# Patient Record
Sex: Male | Born: 1980 | Race: Black or African American | Hispanic: No | Marital: Single | Smoking: Current every day smoker
Health system: Southern US, Community
[De-identification: ages and names within clinical notes are randomized; demographics above are authoritative.]

---

## 2012-02-06 ENCOUNTER — Emergency Department (HOSPITAL_BASED_OUTPATIENT_CLINIC_OR_DEPARTMENT_OTHER)
Admission: EM | Admit: 2012-02-06 | Discharge: 2012-02-06 | Disposition: A | Discharge status: Home / Self Care | Payer: Self-pay | Attending: Emergency Medicine | Admitting: Emergency Medicine

## 2012-02-06 ENCOUNTER — Encounter (HOSPITAL_BASED_OUTPATIENT_CLINIC_OR_DEPARTMENT_OTHER): Payer: Self-pay | Admitting: Family Medicine

## 2012-02-06 DIAGNOSIS — B029 Zoster without complications: Secondary | ICD-10-CM | POA: Insufficient documentation

## 2012-02-06 MED ORDER — OXYCODONE-ACETAMINOPHEN 5-325 MG PO TABS: 2.0000 | ORAL_TABLET | ORAL | Status: AC | PRN

## 2012-02-06 MED ORDER — ACYCLOVIR 400 MG PO TABS: 400.0000 mg | ORAL_TABLET | Freq: Four times a day (QID) | ORAL | Status: AC

## 2012-02-06 NOTE — Discharge Instructions (Signed)

## 2012-02-06 NOTE — ED Notes (Signed)
Pt has rash and pain to left side x 1 wk.

## 2012-02-06 NOTE — ED Provider Notes (Addendum)
History     CSN: [id number]  Arrival date & time 02/06/12  1037   First MD Initiated Contact with Patient 02/06/12 1117      Chief Complaint  Patient presents with  . Rash    (Consider location/radiation/quality/duration/timing/severity/associated sxs/prior treatment) HPI Comments: Patient presents with a one-week history of a rash to his left lower chest area. He woke up one morning with burning pain and the next day developed some small blisters in the area. He states that the rash has gotten better, but he still has a burning pain to the area. Denies any fevers, vomiting, or other symptoms associated with it. He did have chickenpox as a child  Patient is a 31 y.o. male presenting with rash. The history is provided by the patient.  Rash  This is a new problem. The current episode started more than 1 week ago. The problem has been rapidly improving. The problem is associated with nothing. There has been no fever. The pain is moderate. The pain has been constant since onset. Associated symptoms include blisters and pain.    History reviewed. No pertinent past medical history.  History reviewed. No pertinent past surgical history.  No family history on file.  History  Substance Use Topics  . Smoking status: Current Everyday Smoker  . Smokeless tobacco: Not on file  . Alcohol Use: Yes      Review of Systems  Constitutional: Negative for fever, chills, diaphoresis and fatigue.  HENT: Negative for congestion, rhinorrhea and sneezing.   Eyes: Negative.   Respiratory: Negative for cough, chest tightness and shortness of breath.   Cardiovascular: Negative for chest pain and leg swelling.  Gastrointestinal: Negative for nausea, vomiting, abdominal pain, diarrhea and blood in stool.  Genitourinary: Negative for frequency, hematuria, flank pain and difficulty urinating.  Musculoskeletal: Negative for back pain and arthralgias.  Skin: Positive for rash.  Neurological: Negative for  dizziness, speech difficulty, weakness, numbness and headaches.    Allergies  Review of patient's allergies indicates no known allergies.  Home Medications   Current Outpatient Rx  Name Route Sig Dispense Refill  . ACYCLOVIR 400 MG PO TABS Oral Take 1 tablet (400 mg total) by mouth 4 (four) times daily. 28 tablet 0  . OXYCODONE-ACETAMINOPHEN 5-325 MG PO TABS Oral Take 2 tablets by mouth every 4 (four) hours as needed for pain. 20 tablet 0    BP 109/68  Pulse 84  Temp(Src) 98.3 F (36.8 C) (Oral)  Resp 16  Ht 6' (1.829 m)  Wt 162 lb (73.483 kg)  BMI 21.97 kg/m2  SpO2 100%  Physical Exam  Constitutional: He is oriented to person, place, and time. He appears well-developed and well-nourished.  HENT:  Head: Normocephalic and atraumatic.  Eyes: Pupils are equal, round, and reactive to light.  Neck: Normal range of motion. Neck supple.  Cardiovascular: Normal rate, regular rhythm and normal heart sounds.   Pulmonary/Chest: Effort normal and breath sounds normal. No respiratory distress. He has no wheezes. He has no rales. He exhibits no tenderness.  Abdominal: Soft. Bowel sounds are normal. There is no tenderness. There is no rebound and no guarding.  Musculoskeletal: Normal range of motion. He exhibits no edema.  Lymphadenopathy:    He has no cervical adenopathy.  Neurological: He is alert and oriented to person, place, and time.  Skin: Skin is warm and dry. Rash noted.       He has a small area of rash along the left lower rib area along  his chest. There some small healing blisters. There is no erythema or signs of cellulitis. It does appear to follow a dermatome  Psychiatric: He has a normal mood and affect.    ED Course  Procedures (including critical care time)  Labs Reviewed - No data to display No results found.   1. Shingles       MDM  Rash appears consistent with shingles. We'll start him on acyclovir and pain medicine and encourage follow up with primary  care physician        Rolan Bucco, MD 02/06/12 1402  Rolan Bucco, MD 02/06/12 1402

## 2018-12-25 ENCOUNTER — Other Ambulatory Visit: Payer: Self-pay

## 2018-12-25 ENCOUNTER — Encounter: Payer: Self-pay | Admitting: Emergency Medicine

## 2018-12-25 ENCOUNTER — Emergency Department
Admission: EM | Admit: 2018-12-25 | Discharge: 2018-12-25 | Disposition: A | Discharge status: Home / Self Care | Payer: Self-pay | Source: Home / Self Care | Attending: Family Medicine | Admitting: Family Medicine

## 2018-12-25 DIAGNOSIS — R110 Nausea: Secondary | ICD-10-CM

## 2018-12-25 LAB — POCT CBC W AUTO DIFF (K'VILLE URGENT CARE)

## 2018-12-25 LAB — POCT URINALYSIS DIP (MANUAL ENTRY)
Bilirubin, UA: NEGATIVE
Blood, UA: NEGATIVE
Glucose, UA: NEGATIVE mg/dL
Leukocytes, UA: NEGATIVE
Nitrite, UA: NEGATIVE
Protein Ur, POC: NEGATIVE mg/dL
Spec Grav, UA: 1.025 (ref 1.010–1.025)
Urobilinogen, UA: 1 E.U./dL
pH, UA: 5.5 (ref 5.0–8.0)

## 2018-12-25 MED ORDER — ONDANSETRON 4 MG PO TBDP: ORAL_TABLET | ORAL | 0 refills | Status: DC

## 2018-12-25 MED ORDER — ONDANSETRON 4 MG PO TBDP: 4.0000 mg | ORAL_TABLET | Freq: Once | ORAL | Status: DC

## 2018-12-25 NOTE — Discharge Instructions (Signed)
Begin clear liquids for about 12 hours, then may begin a BRAT diet (Bananas, Rice, Applesauce, Toast) when nausea resolved.  Then gradually advance to a regular diet as tolerated.    °  °If symptoms become significantly worse during the night or over the weekend, proceed to the local emergency room.  °

## 2018-12-25 NOTE — ED Provider Notes (Signed)
[facility]    CSN: [id number] Arrival date & time: 12/25/18  0940     History   Chief Complaint Chief Complaint  Patient presents with  . Nausea    HPI Johnathan Good is a 38 y.o. male.   Patient complains of onset of onset of abdominal pain and nausea without vomiting about 5 days ago.  He denies vomiting or diarrhea and his symptoms have been worse during the night.  He states that he had been working outside in the heat, and had felt mildly short of breath.  He denies chest pain, cough, GI, or URI symptoms.   The history is provided by the patient.  Abdominal Pain  Pain location:  Periumbilical Pain quality: cramping   Pain radiates to:  Does not radiate Pain severity:  Mild Onset quality:  Sudden Duration:  5 days Timing:  Sporadic Progression:  Unchanged Chronicity:  New Context: eating   Context: not alcohol use, not awakening from sleep, not diet changes, not medication withdrawal, not recent illness, not recent travel, not sick contacts, not suspicious food intake and not trauma   Relieved by:  Nothing Worsened by:  Eating Ineffective treatments:  None tried Associated symptoms: anorexia, belching, chills, fatigue and nausea   Associated symptoms: no chest pain, no constipation, no cough, no diarrhea, no dysuria, no fever, no hematemesis, no hematochezia, no hematuria, no melena, no shortness of breath, no sore throat and no vomiting   Risk factors: has not had multiple surgeries     History reviewed. No pertinent past medical history.  There are no active problems to display for this patient.   History reviewed. No pertinent surgical history.     Home Medications    Prior to Admission medications   Medication Sig Start Date End Date Taking? Authorizing Provider  ondansetron (ZOFRAN ODT) 4 MG disintegrating tablet Take one tab by mouth Q6hr prn nausea.  Dissolve under tongue. 12/25/18   Lattie HawBeese, Paton Crum A, MD    Family History History  reviewed. No pertinent family history.  Social History Social History   Tobacco Use  . Smoking status: Current Every Day Smoker  . Smokeless tobacco: Never Used  Substance Use Topics  . Alcohol use: Yes  . Drug use: No     Allergies   Patient has no known allergies.   Review of Systems Review of Systems  Constitutional: Positive for chills and fatigue. Negative for fever.  HENT: Negative for sore throat.   Respiratory: Negative for cough and shortness of breath.   Cardiovascular: Negative for chest pain.  Gastrointestinal: Positive for abdominal pain, anorexia and nausea. Negative for constipation, diarrhea, hematemesis, hematochezia, melena and vomiting.  Genitourinary: Negative for dysuria and hematuria.     Physical Exam Triage Vital Signs ED Triage Vitals  Enc Vitals Group     BP 12/25/18 1032 109/73     Pulse Rate 12/25/18 1032 93     Resp --      Temp 12/25/18 1032 98.6 F (37 C)     Temp Source 12/25/18 1032 Oral     SpO2 12/25/18 1056 97 %     Weight 12/25/18 1035 162 lb (73.5 kg)     Height --      Head Circumference --      Peak Flow --      Pain Score --      Pain Loc --      Pain Edu? --      Excl. in GC? --  No data found.  Updated Vital Signs BP 109/73 (BP Location: Right Arm)   Pulse 93   Temp 98.6 F (37 C) (Oral)   Wt 73.5 kg   SpO2 97%   BMI 21.97 kg/m   Visual Acuity Right Eye Distance:   Left Eye Distance:   Bilateral Distance:    Right Eye Near:   Left Eye Near:    Bilateral Near:     Physical Exam Vitals signs and nursing note reviewed.  Constitutional:      General: He is not in acute distress.    Appearance: Normal appearance.  HENT:     Head: Normocephalic.     Right Ear: External ear normal.     Left Ear: External ear normal.     Nose: Nose normal.     Mouth/Throat:     Mouth: Mucous membranes are moist.     Pharynx: Oropharynx is clear.  Eyes:     Pupils: Pupils are equal, round, and reactive to light.   Cardiovascular:     Rate and Rhythm: Normal rate.     Heart sounds: Normal heart sounds.  Pulmonary:     Breath sounds: Normal breath sounds.  Abdominal:     General: Abdomen is flat. Bowel sounds are normal.     Palpations: Abdomen is soft. There is no hepatomegaly, splenomegaly or mass.     Tenderness: There is abdominal tenderness in the right lower quadrant, periumbilical area and left lower quadrant. There is no right CVA tenderness, left CVA tenderness, guarding or rebound. Negative signs include Murphy's sign and McBurney's sign.     Hernia: No hernia is present.    Musculoskeletal:     Right lower leg: No edema.     Left lower leg: No edema.  Lymphadenopathy:     Cervical: No cervical adenopathy.  Skin:    General: Skin is warm and dry.     Findings: No rash.  Neurological:     Mental Status: He is alert.      UC Treatments / Results  Labs (all labs ordered are listed, but only abnormal results are displayed) Labs Reviewed  POCT URINALYSIS DIP (MANUAL ENTRY) - Abnormal; Notable for the following components:      Result Value   Ketones, POC UA trace (5) (*)    All other components within normal limits  POCT CBC W AUTO DIFF ([facility]):  WBC 7.4; LY 47.0; MO 2.8; GR 50.2; Hgb 14.5; Platelets 206     EKG None  Radiology No results found.  Procedures Procedures (including critical care time)  Medications Ordered in UC Medications  ondansetron (ZOFRAN-ODT) disintegrating tablet 4 mg (has no administration in time range)    Initial Impression / Assessment and Plan / UC Course  I have reviewed the triage vital signs and the nursing notes.  Pertinent labs & imaging results that were available during my care of the patient were reviewed by me and considered in my medical decision making (see chart for details).    Administered Zofran ODT  PO; given Rx for same. Normal CBC and urinalysis reassuring. Suspect viral gastroenteritis. Followup with  Family Doctor if not improved in about 5 days.   Final Clinical Impressions(s) / UC Diagnoses   Final diagnoses:  Nausea     Discharge Instructions     Begin clear liquids for about 12 hours, then may begin a BRAT diet (Bananas, Rice, Applesauce, Toast) when nausea  resolved.  Then gradually advance to  a regular diet as tolerated.     If symptoms become significantly worse during the night or over the weekend, proceed to the local emergency room.     ED Prescriptions    Medication Sig Dispense Auth. Provider   ondansetron (ZOFRAN ODT) 4 MG disintegrating tablet Take one tab by mouth Q6hr prn nausea.  Dissolve under tongue. 12 tablet Lattie Haw, MD         Lattie Haw, MD 01/03/19 2241

## 2018-12-25 NOTE — ED Triage Notes (Signed)
Pt c/o abdominal pain and nausea x5 days. Worse at night. No Vomiting or diarrhea.

## 2018-12-28 ENCOUNTER — Telehealth: Payer: Self-pay

## 2018-12-28 NOTE — Telephone Encounter (Signed)
VM not set up.

## 2019-01-09 ENCOUNTER — Other Ambulatory Visit: Payer: Self-pay

## 2019-01-09 ENCOUNTER — Encounter (HOSPITAL_BASED_OUTPATIENT_CLINIC_OR_DEPARTMENT_OTHER): Payer: Self-pay | Admitting: *Deleted

## 2019-01-09 ENCOUNTER — Emergency Department (HOSPITAL_BASED_OUTPATIENT_CLINIC_OR_DEPARTMENT_OTHER)
Admission: EM | Admit: 2019-01-09 | Discharge: 2019-01-09 | Disposition: A | Discharge status: Home / Self Care | Payer: Self-pay | Attending: Emergency Medicine | Admitting: Emergency Medicine

## 2019-01-09 DIAGNOSIS — F17210 Nicotine dependence, cigarettes, uncomplicated: Secondary | ICD-10-CM | POA: Insufficient documentation

## 2019-01-09 DIAGNOSIS — E860 Dehydration: Secondary | ICD-10-CM | POA: Insufficient documentation

## 2019-01-09 DIAGNOSIS — Z79899 Other long term (current) drug therapy: Secondary | ICD-10-CM | POA: Insufficient documentation

## 2019-01-09 DIAGNOSIS — E876 Hypokalemia: Secondary | ICD-10-CM | POA: Insufficient documentation

## 2019-01-09 LAB — CBG MONITORING, ED: Glucose-Capillary: 142 mg/dL — ABNORMAL HIGH (ref 70–99)

## 2019-01-09 LAB — CBC WITH DIFFERENTIAL/PLATELET
Abs Immature Granulocytes: 0.02 10*3/uL (ref 0.00–0.07)
Basophils Absolute: 0.1 10*3/uL (ref 0.0–0.1)
Basophils Relative: 1 %
Eosinophils Absolute: 0.1 10*3/uL (ref 0.0–0.5)
Eosinophils Relative: 1 %
HCT: 39.6 % (ref 39.0–52.0)
Hemoglobin: 13.1 g/dL (ref 13.0–17.0)
Immature Granulocytes: 0 %
Lymphocytes Relative: 56 %
Lymphs Abs: 5.6 10*3/uL — ABNORMAL HIGH (ref 0.7–4.0)
MCH: 28.4 pg (ref 26.0–34.0)
MCHC: 33.1 g/dL (ref 30.0–36.0)
MCV: 85.7 fL (ref 80.0–100.0)
Monocytes Absolute: 0.9 10*3/uL (ref 0.1–1.0)
Monocytes Relative: 9 %
Neutro Abs: 3.3 10*3/uL (ref 1.7–7.7)
Neutrophils Relative %: 33 %
Platelets: 216 10*3/uL (ref 150–400)
RBC: 4.62 MIL/uL (ref 4.22–5.81)
RDW: 12.8 % (ref 11.5–15.5)
WBC: 10 10*3/uL (ref 4.0–10.5)
nRBC: 0 % (ref 0.0–0.2)

## 2019-01-09 LAB — COMPREHENSIVE METABOLIC PANEL
ALT: 32 U/L (ref 0–44)
AST: 29 U/L (ref 15–41)
Albumin: 4.3 g/dL (ref 3.5–5.0)
Alkaline Phosphatase: 69 U/L (ref 38–126)
Anion gap: 11 (ref 5–15)
BUN: 9 mg/dL (ref 6–20)
CO2: 21 mmol/L — ABNORMAL LOW (ref 22–32)
Calcium: 9.3 mg/dL (ref 8.9–10.3)
Chloride: 104 mmol/L (ref 98–111)
Creatinine, Ser: 1.14 mg/dL (ref 0.61–1.24)
GFR calc Af Amer: >60 mL/min (ref >60)
GFR calc non Af Amer: >60 mL/min (ref >60)
Glucose, Bld: 143 mg/dL — ABNORMAL HIGH (ref 70–99)
Potassium: 3.1 mmol/L — ABNORMAL LOW (ref 3.5–5.1)
Sodium: 136 mmol/L (ref 135–145)
Total Bilirubin: 0.6 mg/dL (ref 0.3–1.2)
Total Protein: 7.4 g/dL (ref 6.5–8.1)

## 2019-01-09 LAB — URINALYSIS, ROUTINE W REFLEX MICROSCOPIC
Bilirubin Urine: NEGATIVE
Glucose, UA: NEGATIVE mg/dL
Hgb urine dipstick: NEGATIVE
Ketones, ur: NEGATIVE mg/dL
Leukocytes,Ua: NEGATIVE
Nitrite: NEGATIVE
Protein, ur: NEGATIVE mg/dL
Specific Gravity, Urine: 1.01 (ref 1.005–1.030)
pH: 7 (ref 5.0–8.0)

## 2019-01-09 MED ORDER — POTASSIUM CHLORIDE CRYS ER 20 MEQ PO TBCR
40.0000 meq | EXTENDED_RELEASE_TABLET | Freq: Once | ORAL | Status: AC
  Administered 2019-01-09: 40 meq via ORAL
  Filled 2019-01-09: qty 2

## 2019-01-09 MED ORDER — ONDANSETRON HCL 4 MG/2ML IJ SOLN
4.0000 mg | Freq: Once | INTRAMUSCULAR | Status: AC
  Administered 2019-01-09: 21:00:00 4 mg via INTRAVENOUS
  Filled 2019-01-09: qty 2

## 2019-01-09 MED ORDER — LACTATED RINGERS IV BOLUS
1000.0000 mL | Freq: Once | INTRAVENOUS | Status: AC
  Administered 2019-01-09: 22:00:00 1000 mL via INTRAVENOUS

## 2019-01-09 MED ORDER — POTASSIUM CHLORIDE CRYS ER 20 MEQ PO TBCR: 20.0000 meq | EXTENDED_RELEASE_TABLET | Freq: Every day | ORAL | 0 refills | Status: DC

## 2019-01-09 MED ORDER — ONDANSETRON 4 MG PO TBDP: 4.0000 mg | ORAL_TABLET | Freq: Three times a day (TID) | ORAL | 0 refills | Status: DC | PRN

## 2019-01-09 MED ORDER — LACTATED RINGERS IV BOLUS
1000.0000 mL | Freq: Once | INTRAVENOUS | Status: AC
  Administered 2019-01-09: 21:00:00 1000 mL via INTRAVENOUS

## 2019-01-09 NOTE — ED Notes (Signed)
Pt was given a urinal and notified that we need a urine sample when he is able to produce

## 2019-01-09 NOTE — ED Notes (Signed)
Pt understood dc material. NAD noted. Scripts given at dc. All questions answered to satisfaction. Pt escorted to check out window 

## 2019-01-09 NOTE — ED Provider Notes (Signed)
[facility] EMERGENCY DEPARTMENT Provider Note   CSN: [id number] Arrival date & time: 01/09/19  2021    History   Chief Complaint Chief Complaint  Patient presents with  . Nausea    HPI Johnathan Good is a 38 y.o. male.     HPI  38 year old male presents with nausea.  Started about an hour ago.  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  He has not vomited.  Took a shower and thought he felt better but now the nausea has returned.  He also feels a little lightheaded/dizzy.  There is no headache, chest pain, back pain, cough, shortness of breath or abdominal pain.  No urinary symptoms.  He states he drank a [employer] and a little bit of water today.  No other illness.  He denies any past medical history.  History reviewed. No pertinent past medical history.  There are no active problems to display for this patient.   History reviewed. No pertinent surgical history.      Home Medications    Prior to Admission medications   Medication Sig Start Date End Date Taking? Authorizing Provider  ondansetron (ZOFRAN ODT) 4 MG disintegrating tablet Take one tab by mouth Q6hr prn nausea.  Dissolve under tongue. 12/25/18   Lattie Haw, MD    Family History No family history on file.  Social History Social History   Tobacco Use  . Smoking status: Current Every Day Smoker    Types: Cigarettes  . Smokeless tobacco: Never Used  Substance Use Topics  . Alcohol use: Yes  . Drug use: No     Allergies   Patient has no known allergies.   Review of Systems Review of Systems  Constitutional: Negative for fever.  Respiratory: Negative for cough and shortness of breath.   Cardiovascular: Negative for chest pain.  Gastrointestinal: Positive for abdominal pain and nausea. Negative for vomiting.  Genitourinary: Negative for dysuria and hematuria.  Neurological: Positive for light-headedness.  All other  systems reviewed and are negative.    Physical Exam Updated Vital Signs Pulse (!) 125   Temp 98.6 F (37 C)   Resp 20   Ht 5\' 10"  (1.778 m)   Wt 72.6 kg   SpO2 100%   BMI 22.96 kg/m   Physical Exam Vitals signs and nursing note reviewed.  Constitutional:      General: He is not in acute distress.    Appearance: He is well-developed. He is not ill-appearing or diaphoretic.  HENT:     Head: Normocephalic and atraumatic.     Right Ear: External ear normal.     Left Ear: External ear normal.     Nose: Nose normal.  Eyes:     General:        Right eye: No discharge.        Left eye: No discharge.  Neck:     Musculoskeletal: Neck supple.  Cardiovascular:     Rate and Rhythm: Regular rhythm. Tachycardia present.     Heart sounds: Normal heart sounds.  Pulmonary:     Effort: Pulmonary effort is normal.     Breath sounds: Normal breath sounds.  Abdominal:     Palpations: Abdomen is soft.     Tenderness: There is no abdominal tenderness. There is no right CVA tenderness or left CVA tenderness.  Skin:    General: Skin is warm and dry.  Neurological:  Mental Status: He is alert.  Psychiatric:        Mood and Affect: Mood is not anxious.      ED Treatments / Results  Labs (all labs ordered are listed, but only abnormal results are displayed) Labs Reviewed  COMPREHENSIVE METABOLIC PANEL - Abnormal; Notable for the following components:      Result Value   Potassium 3.1 (*)    CO2 21 (*)    Glucose, Bld 143 (*)    All other components within normal limits  CBC WITH DIFFERENTIAL/PLATELET - Abnormal; Notable for the following components:   Lymphs Abs 5.6 (*)    All other components within normal limits  CBG MONITORING, ED - Abnormal; Notable for the following components:   Glucose-Capillary 142 (*)    All other components within normal limits  URINALYSIS, ROUTINE W REFLEX MICROSCOPIC    EKG EKG Interpretation  Date/Time:  Saturday January 09 2019 20:35:14 EDT  Ventricular Rate:  125 PR Interval:    QRS Duration: 84 QT Interval:  308 QTC Calculation: 445 R Axis:   29 Text Interpretation:  Sinus tachycardia Probable left atrial enlargement Baseline wander in lead(s) I II III aVL aVF V1 V2 V3 V4 V5 V6 no acute ST/T changes No old tracing to compare Confirmed by Pricilla LovelessGoldston, Jahvier Aldea [phone]([id number]) on 01/09/2019 8:41:37 PM   Radiology No results found.  Procedures Procedures (including critical care time)  Medications Ordered in ED Medications  lactated ringers bolus 1,000 mL (0 mLs Intravenous Stopped 01/09/19 2154)  ondansetron (ZOFRAN) injection 4 mg (4 mg Intravenous Given 01/09/19 2050)  potassium chloride SA (K-DUR) CR tablet 40 mEq (40 mEq Oral Given 01/09/19 2154)  lactated ringers bolus 1,000 mL (1,000 mLs Intravenous New Bag/Given 01/09/19 2154)     Initial Impression / Assessment and Plan / ED Course  I have reviewed the triage vital signs and the nursing notes.  Pertinent labs & imaging results that were available during my care of the patient were reviewed by me and considered in my medical decision making (see chart for details).        Patient is feeling much better after fluids and Zofran.  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 No infectious signs or symptoms. Mild hypokalemia, will replete here and with short Rx. Encouraged more fluids. HR normalized, no fever, and other vitals are stable. D/c home.  Final Clinical Impressions(s) / ED Diagnoses   Final diagnoses:  Dehydration  Hypokalemia    ED Discharge Orders    None       Pricilla LovelessGoldston, Laquenta Whitsell, MD 01/09/19 2238

## 2019-01-09 NOTE — ED Notes (Signed)
ED Provider at bedside. 

## 2019-01-09 NOTE — ED Triage Notes (Signed)
Pt reports feeling nauseated after working today. States he began feeling bad when he got home about an hour ago. Denies fever and cough

## 2019-04-02 ENCOUNTER — Encounter: Payer: Self-pay | Admitting: Emergency Medicine

## 2019-04-02 ENCOUNTER — Emergency Department
Admission: EM | Admit: 2019-04-02 | Discharge: 2019-04-02 | Disposition: A | Discharge status: Home / Self Care | Payer: Self-pay | Source: Home / Self Care | Attending: Emergency Medicine | Admitting: Emergency Medicine

## 2019-04-02 ENCOUNTER — Other Ambulatory Visit: Payer: Self-pay

## 2019-04-02 DIAGNOSIS — E860 Dehydration: Secondary | ICD-10-CM

## 2019-04-02 NOTE — Discharge Instructions (Addendum)
Based on history and physical exam, your diagnosis is mild dehydration.  The dehydration is not severe enough to require IV fluids. Please read attached instruction sheet on dehydration.  The most important treatment is drinking plenty of fluids including regular water and Gatorade.  Eat regular bland food that is not greasy or spicy. You need to stay home and rest in air conditioning for the next 3 days. In my opinion, you do not have any history or physical findings to suggest COVID-19. If you have persistent symptoms, return here to reevaluate.  If you develop fever or cough or other COVID-19 symptoms, but otherwise feels okay, may return here to reevaluate and will do a COVID-19 test. If any severe symptoms, go to emergency room immediately.

## 2019-04-02 NOTE — ED Provider Notes (Signed)
[facility]    CSN: [id number] Arrival date & time: 04/02/19  1334     History   Chief Complaint Chief Complaint  Patient presents with  . Weakness    HPI Johnathan Good is a 38 y.o. male.   HPI Patient works outdoors vigorously in heat and has not felt normal during night; we have had many recent days where it is hot, well over 90 degrees with high humidity.  states he does drink Gatorade and water but wonders if correct amount of that plus food intake. Denies nausea, vomiting, diarrhea. He has been dehydrated in past: Most recently April 2020 requiring [facility], where he improved after IV fluids.-He states that compared to his dehydration in April 2020, he is feeling much better than that today.  He admits that he does not drink enough fluids during the day while he is working vigorously in the heat  He has been able to tolerate p.o.'s well.  No nausea or vomiting or diarrhea or fever or chills or chest pain or shortness of breath or loss of taste or smell or abdominal pain.  No voiding symptoms.  No focal neurologic symptoms.  Just feels generally fatigue.  No fainting or loss of consciousness.  No headache.  No ENT symptoms.  No cough or chest pain or shortness of breath  History reviewed. No pertinent past medical history. Past medical history negative for chronic disease. There are no active problems to display for this patient.   History reviewed. No pertinent surgical history.     Home Medications    Prior to Admission medications   Medication Sig Start Date End Date Taking? Authorizing Provider  potassium chloride SA (K-DUR) 20 MEQ tablet Take 1 tablet (20 mEq total) by mouth daily. 01/09/19 04/02/19  Pricilla LovelessGoldston, Scott, MD    Family History No family history on file.  Social History Social History   Tobacco Use  . Smoking status: Current Every Day Smoker    Types: Cigarettes  . Smokeless tobacco: Never Used  Substance  Use Topics  . Alcohol use: Yes  . Drug use: No   He smokes cigarettes daily.  Allergies   Patient has no known allergies.   Review of Systems Review of Systems  All other systems reviewed and are negative.  Pertinent items noted in HPI and remainder of comprehensive ROS otherwise negative.  Physical Exam Triage Vital Signs ED Triage Vitals [04/02/19 1359]  Enc Vitals Group     BP 115/73     Pulse Rate 100     Resp 18     Temp 98.9 F (37.2 C)     Temp Source Oral     SpO2 98 %     Weight 162 lb (73.5 kg)     Height 5\' 10"  (1.778 m)     Head Circumference      Peak Flow      Pain Score 0     Pain Loc      Pain Edu?      Excl. in GC?    No data found.  Updated Vital Signs BP 115/73 (BP Location: Right Arm)   Pulse 100   Temp 98.9 F (37.2 C) (Oral)   Resp 18   Ht 5\' 10"  (1.778 m)   Wt 73.5 kg   SpO2 98%   BMI 23.24 kg/m   Physical Exam Vitals signs reviewed.  Repeated resting pulse while sitting 92, regular. Orthostatic vital  signs standing: Pulse 100.  BP 112/70. Constitutional:      General: He is not in acute distress.    Appearance: He is well-developed.  HENT:     Head: Normocephalic and atraumatic.  Nontender.  Nose and pharynx normal. Eyes:     General: No scleral icterus.    Pupils: Pupils are equal, round, and reactive to light.  Neck:     Musculoskeletal: Normal range of motion and neck supple.  Nontender.  No adenopathy. Cardiovascular:     Rate and Rhythm: Normal rate and regular rhythm.  No murmur. Pulmonary:     Effort: Pulmonary effort is normal.  Lungs clear to auscultation. Abdominal:     General: There is no distension.  Abdomen soft nontender no guarding, organomegaly, or masses. Skin:    General: Skin is warm and dry.  No rash Neurological:     Mental Status: He is alert and oriented to person, place, and time.     Cranial Nerves: No cranial nerve deficit.  Motor and sensory and DTRs normal.  Gait normal. Psychiatric:         Behavior: Behavior normal.    UC Treatments / Results  Labs (all labs ordered are listed, but only abnormal results are displayed) Labs Reviewed - No data to display  EKG   Radiology No results found.  Procedures Procedures (including critical care time)  Medications Ordered in UC Medications - No data to display  Initial Impression / Assessment and Plan / UC Course  I have reviewed the triage vital signs and the nursing notes.  Pertinent labs & imaging results that were available during my care of the patient were reviewed by me and considered in my medical decision making (see chart for details).    He clinically has mild dehydration, but no definite signs or symptoms of COVID-19.  He declined the option of testing for COVID-19.  Not orthostatic.  Normal orthostatic vital signs.  Over 25 minutes spent, greater than 50% of the time spent for counseling and coordination of care. He had numerous questions and I answered as best I could.  Oral and AVS-written instructions given.  Precautions discussed. Red flags discussed. Questions invited and answered. Patient voiced understanding and agreement.   Final Clinical Impressions(s) / UC Diagnoses   Final diagnoses:  Dehydration     Discharge Instructions     Based on history and physical exam, your diagnosis is mild dehydration.  The dehydration is not severe enough to require IV fluids. Please read attached instruction sheet on dehydration.  The most important treatment is drinking plenty of fluids including regular water and Gatorade.  Eat regular bland food that is not greasy or spicy. You need to stay home and rest in air conditioning for the next 3 days. In my opinion, you do not have any history or physical findings to suggest COVID-19. If you have persistent symptoms, return here to reevaluate.  If you develop fever or cough or other COVID-19 symptoms, but otherwise feels okay, may return here to reevaluate and  will do a COVID-19 test. If any severe symptoms, go to emergency room immediately.     ED Prescriptions    None     Controlled Substance Prescriptions [place] Controlled Substance Registry consulted? Not Applicable   Jacqulyn Cane, MD 04/02/19 [phone]

## 2019-04-02 NOTE — ED Triage Notes (Signed)
Patient works outdoors in heat and has not felt normal during night; states he does drink Gatorade and water but wonders if correct amount of that plus food intake. Denies nausea, vomiting, diarrhea. He has been dehydrated in past.

## 2019-04-09 ENCOUNTER — Telehealth: Payer: Self-pay | Admitting: General Practice

## 2019-04-09 NOTE — Telephone Encounter (Signed)
Patient requested a new patient appointment. The number on file is not set up for voicemail.

## 2019-08-06 ENCOUNTER — Emergency Department (HOSPITAL_BASED_OUTPATIENT_CLINIC_OR_DEPARTMENT_OTHER)
Admission: EM | Admit: 2019-08-06 | Discharge: 2019-08-06 | Disposition: A | Discharge status: Home / Self Care | Payer: Self-pay | Attending: Emergency Medicine | Admitting: Emergency Medicine

## 2019-08-06 ENCOUNTER — Encounter (HOSPITAL_BASED_OUTPATIENT_CLINIC_OR_DEPARTMENT_OTHER): Payer: Self-pay | Admitting: *Deleted

## 2019-08-06 ENCOUNTER — Emergency Department (HOSPITAL_BASED_OUTPATIENT_CLINIC_OR_DEPARTMENT_OTHER): Payer: Self-pay

## 2019-08-06 ENCOUNTER — Other Ambulatory Visit: Payer: Self-pay

## 2019-08-06 DIAGNOSIS — Z20828 Contact with and (suspected) exposure to other viral communicable diseases: Secondary | ICD-10-CM | POA: Insufficient documentation

## 2019-08-06 DIAGNOSIS — F17210 Nicotine dependence, cigarettes, uncomplicated: Secondary | ICD-10-CM | POA: Insufficient documentation

## 2019-08-06 DIAGNOSIS — J069 Acute upper respiratory infection, unspecified: Secondary | ICD-10-CM | POA: Insufficient documentation

## 2019-08-06 LAB — SARS CORONAVIRUS 2 AG (30 MIN TAT): SARS Coronavirus 2 Ag: NEGATIVE

## 2019-08-06 NOTE — Discharge Instructions (Signed)
Avoid any over-the-counter medications that have ephedrine or pseudoephedrine.  Continue to stay well-hydrated and have good nutrition.  The Covid results should return within the next 24 hours and if Covid positive then you need to quarantine for 14 days since your symptoms started.  If negative and your symptoms are resolved you can return to work by Monday

## 2019-08-06 NOTE — ED Triage Notes (Signed)
Pt c/o sudden onset of chest pain and palpations x 2 mins earlier today

## 2019-08-06 NOTE — ED Provider Notes (Signed)
[facility] EMERGENCY DEPARTMENT Provider Note   CSN: [id number] Arrival date & time: 08/06/19  1706     History   Chief Complaint Chief Complaint  Patient presents with  . Chest Pain    HPI Johnathan Good is a 38 y.o. male.     Patient is a 38 year old male with a history of tobacco abuse but no other known medical problems presenting today with palpitations and shortness of breath after running up some stairs at his home.  Patient states since Wednesday he has had some chills, cough, minimal myalgias and just feeling sick.  He has been taking Mucinex all-in-one which has made him feel better.  He has been eating and drinking normally and states he did feel better today so he went to ask somebody where he lives to get a toilet fixed and he came back up the stairs and states after running up the stairs he felt short of breath and his heart was racing.  This improved within a few minutes after he sat down and rest and states now he feels fine.  He has never had symptoms like that before and denies a prior history of asthma.  No recent immobilizations or unilateral leg pain or swelling.  No drug or alcohol use.  No known Covid contacts  The history is provided by the patient.  Chest Pain   History reviewed. No pertinent past medical history.  There are no active problems to display for this patient.   History reviewed. No pertinent surgical history.      Home Medications    Prior to Admission medications   Medication Sig Start Date End Date Taking? Authorizing Provider  potassium chloride SA (K-DUR) 20 MEQ tablet Take 1 tablet (20 mEq total) by mouth daily. 01/09/19 04/02/19  Pricilla Loveless, MD    Family History No family history on file.  Social History Social History   Tobacco Use  . Smoking status: Current Every Day Smoker    Packs/day: 1.00    Types: Cigarettes  . Smokeless tobacco: Never Used  Substance Use Topics  . Alcohol use: Yes  . Drug use: No      Allergies   Patient has no known allergies.   Review of Systems Review of Systems  Cardiovascular: Positive for chest pain.  All other systems reviewed and are negative.    Physical Exam Updated Vital Signs BP 127/87   Pulse 93   Temp 98 F (36.7 C) (Oral)   Resp 17   Ht 5\' 10"  (1.778 m)   Wt 72.6 kg   SpO2 100%   BMI 22.96 kg/m   Physical Exam Vitals signs and nursing note reviewed.  Constitutional:      General: He is not in acute distress.    Appearance: He is well-developed.  HENT:     Head: Normocephalic and atraumatic.  Eyes:     Conjunctiva/sclera: Conjunctivae normal.     Pupils: Pupils are equal, round, and reactive to light.  Neck:     Musculoskeletal: Normal range of motion and neck supple.  Cardiovascular:     Rate and Rhythm: Regular rhythm. Tachycardia present.     Heart sounds: No murmur.  Pulmonary:     Effort: Pulmonary effort is normal. No respiratory distress.     Breath sounds: Normal breath sounds. No wheezing or rales.  Abdominal:     General: There is no distension.     Palpations: Abdomen is soft.     Tenderness: There  is no abdominal tenderness. There is no guarding or rebound.  Musculoskeletal: Normal range of motion.        General: No tenderness.     Right lower leg: He exhibits no tenderness. No edema.     Left lower leg: He exhibits no tenderness. No edema.  Skin:    General: Skin is warm and dry.     Findings: No erythema or rash.  Neurological:     General: No focal deficit present.     Mental Status: He is alert and oriented to person, place, and time. Mental status is at baseline.  Psychiatric:        Mood and Affect: Mood normal.        Behavior: Behavior normal.        Thought Content: Thought content normal.      ED Treatments / Results  Labs (all labs ordered are listed, but only abnormal results are displayed) Labs Reviewed  SARS CORONAVIRUS 2 AG (30 MIN TAT)  NOVEL CORONAVIRUS, NAA (HOSP ORDER,  SEND-OUT TO REF LAB; TAT 18-24 HRS)    EKG EKG Interpretation  Date/Time:  Friday August 06 2019 17:13:40 EST Ventricular Rate:  115 PR Interval:  144 QRS Duration: 82 QT Interval:  316 QTC Calculation: 437 R Axis:   44 Text Interpretation: Sinus tachycardia Right atrial enlargement Borderline ECG No significant change since last tracing Confirmed by Blanchie Dessert ([id number]) on 08/06/2019 5:37:11 PM   Radiology Dg Chest Port 1 View  Result Date: 08/06/2019 CLINICAL DATA:  Cough and dyspnea EXAM: PORTABLE CHEST 1 VIEW COMPARISON:  None. FINDINGS: Normal heart size. Normal mediastinal contour. No pneumothorax. No pleural effusion. Lungs appear clear, with no acute consolidative airspace disease and no pulmonary edema. IMPRESSION: No active disease. Electronically Signed   By: Ilona Sorrel M.D.   On: 08/06/2019 18:46    Procedures Procedures (including critical care time)  Medications Ordered in ED Medications - No data to display   Initial Impression / Assessment and Plan / ED Course  I have reviewed the triage vital signs and the nursing notes.  Pertinent labs & imaging results that were available during my care of the patient were reviewed by me and considered in my medical decision making (see chart for details).        38 year old male presenting today with palpitations and shortness of breath after going up the stairs.  This is in the setting of having URI-like symptoms for the last 3 days.  Patient is taking Mucinex all-in-one that does have phenylephrine which may be the result of his mild tachycardia.  He is in no distress at this time and is not having any significant symptoms of shortness of breath.  He has no prior history of asthma but does smoke cigarettes.  Lungs are clear on exam.  EKG with sinus tachycardia but no other acute changes from prior.  Covid testing pending and chest x-ray pending.  Low suspicion for PE at this time and no evidence of fluid overload,  pericarditis or pneumonia.  7:46 PM Chest x-ray within normal limits and initial rapid Covid was negative so reliable longer send out labs sent.  Patient's repeat vital signs with a heart rate in the 80s.  Suspect that tachycardia is related to over-the-counter medication.  Patient given return precautions  Final Clinical Impressions(s) / ED Diagnoses   Final diagnoses:  Viral upper respiratory tract infection    ED Discharge Orders    None  Gwyneth SproutPlunkett, Bradrick Kamau, MD 08/06/19 1946

## 2019-08-09 LAB — NOVEL CORONAVIRUS, NAA (HOSP ORDER, SEND-OUT TO REF LAB; TAT 18-24 HRS): SARS-CoV-2, NAA: NOT DETECTED

## 2019-11-08 ENCOUNTER — Emergency Department
Admission: EM | Admit: 2019-11-08 | Discharge: 2019-11-08 | Disposition: A | Discharge status: Home / Self Care | Payer: Self-pay | Source: Home / Self Care | Attending: Family Medicine | Admitting: Family Medicine

## 2019-11-08 ENCOUNTER — Other Ambulatory Visit: Payer: Self-pay

## 2019-11-08 ENCOUNTER — Encounter: Payer: Self-pay | Admitting: Emergency Medicine

## 2019-11-08 DIAGNOSIS — J0100 Acute maxillary sinusitis, unspecified: Secondary | ICD-10-CM

## 2019-11-08 MED ORDER — AMOXICILLIN 875 MG PO TABS: 875.0000 mg | ORAL_TABLET | Freq: Two times a day (BID) | ORAL | 0 refills | Status: AC

## 2019-11-08 NOTE — ED Provider Notes (Signed)
[facility]    CSN: [id number] Arrival date & time: 11/08/19  1005      History   Chief Complaint Chief Complaint  Patient presents with  . Nasal Congestion    HPI Johnathan Good is a 39 y.o. male.   Patient complains of persistent sinus congestion for at least 10 days, now developing facial pressure/pain.  He denies URI symptoms, and fevers, chills, and sweats.  He has had no improvement using saline nasal rinse.  The history is provided by the patient.    History reviewed. No pertinent past medical history.  There are no problems to display for this patient.   History reviewed. No pertinent surgical history.     Home Medications    Prior to Admission medications   Medication Sig Start Date End Date Taking? Authorizing Provider  amoxicillin (AMOXIL) 875 MG tablet Take 1 tablet (875 mg total) by mouth 2 (two) times daily. 11/08/19   Lattie Haw, MD  potassium chloride SA (K-DUR) 20 MEQ tablet Take 1 tablet (20 mEq total) by mouth daily. 01/09/19 04/02/19  Pricilla Loveless, MD    Family History Family History  Problem Relation Age of Onset  . Healthy Mother   . Healthy Father     Social History Social History   Tobacco Use  . Smoking status: Current Every Day Smoker    Packs/day: 1.00    Types: Cigarettes  . Smokeless tobacco: Never Used  Substance Use Topics  . Alcohol use: Not Currently  . Drug use: No     Allergies   Patient has no known allergies.   Review of Systems Review of Systems No sore throat No cough No pleuritic pain No wheezing + nasal congestion No post-nasal drainage + sinus pain/pressure No itchy/red eyes No earache No hemoptysis No SOB No fever/chills No nausea No vomiting No abdominal pain No diarrhea No urinary symptoms No skin rash No fatigue No myalgias + headache Used OTC meds (nasal saline) without relief   Physical Exam Triage Vital Signs ED Triage Vitals  Enc Vitals Group     BP  11/08/19 1129 124/76     Pulse Rate 11/08/19 1129 90     Resp --      Temp 11/08/19 1129 98 F (36.7 C)     Temp Source 11/08/19 1129 Oral     SpO2 11/08/19 1129 99 %     Weight 11/08/19 1130 160 lb (72.6 kg)     Height 11/08/19 1130 5\' 10"  (1.778 m)     Head Circumference --      Peak Flow --      Pain Score 11/08/19 1130 1     Pain Loc --      Pain Edu? --      Excl. in GC? --    No data found.  Updated Vital Signs BP 124/76 (BP Location: Right Arm)   Pulse 90   Temp 98 F (36.7 C) (Oral)   Ht 5\' 10"  (1.778 m)   Wt 72.6 kg   SpO2 99%   BMI 22.96 kg/m   Visual Acuity Right Eye Distance:   Left Eye Distance:   Bilateral Distance:    Right Eye Near:   Left Eye Near:    Bilateral Near:     Physical Exam Nursing notes and Vital Signs reviewed. Appearance:  Patient appears stated age, and in no acute distress Eyes:  Pupils are equal, round, and reactive to light and accomodation.  Extraocular movement  is intact.  Conjunctivae are not inflamed  Ears:  Canals normal.  Tympanic membranes normal.  Nose:  Congested turbinates.  Mild maxillary sinus tenderness is present.  Pharynx:  Normal Neck:  Supple.  No adenopathy  Lungs:  Clear to auscultation.  Breath sounds are equal.  Moving air well. Heart:  Regular rate and rhythm without murmurs, rubs, or gallops.  Abdomen:  Nontender without masses or hepatosplenomegaly.  Bowel sounds are present.  No CVA or flank tenderness.  Extremities:  No edema.  Skin:  No rash present.   UC Treatments / Results  Labs (all labs ordered are listed, but only abnormal results are displayed) Labs Reviewed - No data to display  EKG   Radiology No results found.  Procedures Procedures (including critical care time)  Medications Ordered in UC Medications - No data to display  Initial Impression / Assessment and Plan / UC Course  I have reviewed the triage vital signs and the nursing notes.  Pertinent labs & imaging results that  were available during my care of the patient were reviewed by me and considered in my medical decision making (see chart for details).    Begin amoxicillin. Followup with Family Doctor if not improved in 10 days.   Final Clinical Impressions(s) / UC Diagnoses   Final diagnoses:  Acute non-recurrent maxillary sinusitis     Discharge Instructions     May take Pseudoephedrine (30mg , one or two every 4 to 6 hours) for sinus congestion.   May use Afrin nasal spray (or generic oxymetazoline) each morning for about 5 days and then discontinue.  Also recommend using saline nasal spray several times daily and saline nasal irrigation (AYR is a common brand).  Use Flonase nasal spray each morning after using Afrin nasal spray and saline nasal irrigation.     ED Prescriptions    Medication Sig Dispense Auth. Provider   amoxicillin (AMOXIL) 875 MG tablet Take 1 tablet (875 mg total) by mouth 2 (two) times daily. 20 tablet Kandra Nicolas, MD        Kandra Nicolas, MD 11/08/19 [phone]

## 2019-11-08 NOTE — ED Triage Notes (Signed)
Nasal congestion, burning x 1 week

## 2019-11-08 NOTE — Discharge Instructions (Addendum)
May take Pseudoephedrine (30mg, one or two every 4 to 6 hours) for sinus congestion.    May use Afrin nasal spray (or generic oxymetazoline) each morning for about 5 days and then discontinue.  Also recommend using saline nasal spray several times daily and saline nasal irrigation (AYR is a common brand).  Use Flonase nasal spray each morning after using Afrin nasal spray and saline nasal irrigation.   

## 2020-05-06 ENCOUNTER — Encounter (HOSPITAL_COMMUNITY): Payer: Self-pay | Admitting: Emergency Medicine

## 2020-05-06 ENCOUNTER — Encounter: Payer: Self-pay | Admitting: Emergency Medicine

## 2020-05-06 ENCOUNTER — Emergency Department (HOSPITAL_COMMUNITY)
Admission: EM | Admit: 2020-05-06 | Discharge: 2020-05-07 | Disposition: A | Discharge status: Home / Self Care | Payer: 59 | Attending: Emergency Medicine | Admitting: Emergency Medicine

## 2020-05-06 ENCOUNTER — Emergency Department
Admission: EM | Admit: 2020-05-06 | Discharge: 2020-05-06 | Disposition: A | Discharge status: Other Acute Inpatient Hospital | Payer: 59 | Source: Home / Self Care

## 2020-05-06 ENCOUNTER — Other Ambulatory Visit: Payer: Self-pay

## 2020-05-06 ENCOUNTER — Emergency Department (HOSPITAL_COMMUNITY): Payer: 59

## 2020-05-06 DIAGNOSIS — R9431 Abnormal electrocardiogram [ECG] [EKG]: Secondary | ICD-10-CM

## 2020-05-06 DIAGNOSIS — R079 Chest pain, unspecified: Secondary | ICD-10-CM | POA: Diagnosis not present

## 2020-05-06 DIAGNOSIS — F17210 Nicotine dependence, cigarettes, uncomplicated: Secondary | ICD-10-CM | POA: Insufficient documentation

## 2020-05-06 DIAGNOSIS — R0789 Other chest pain: Secondary | ICD-10-CM | POA: Insufficient documentation

## 2020-05-06 LAB — CBC
HCT: 40.8 % (ref 39.0–52.0)
Hemoglobin: 13.3 g/dL (ref 13.0–17.0)
MCH: 28.9 pg (ref 26.0–34.0)
MCHC: 32.6 g/dL (ref 30.0–36.0)
MCV: 88.5 fL (ref 80.0–100.0)
Platelets: 230 10*3/uL (ref 150–400)
RBC: 4.61 MIL/uL (ref 4.22–5.81)
RDW: 12.8 % (ref 11.5–15.5)
WBC: 9.8 10*3/uL (ref 4.0–10.5)
nRBC: 0 % (ref 0.0–0.2)

## 2020-05-06 LAB — BASIC METABOLIC PANEL
Anion gap: 8 (ref 5–15)
BUN: 13 mg/dL (ref 6–20)
CO2: 25 mmol/L (ref 22–32)
Calcium: 9.1 mg/dL (ref 8.9–10.3)
Chloride: 105 mmol/L (ref 98–111)
Creatinine, Ser: 1.25 mg/dL — ABNORMAL HIGH (ref 0.61–1.24)
GFR calc Af Amer: >60 mL/min (ref >60)
GFR calc non Af Amer: >60 mL/min (ref >60)
Glucose, Bld: 107 mg/dL — ABNORMAL HIGH (ref 70–99)
Potassium: 4.5 mmol/L (ref 3.5–5.1)
Sodium: 138 mmol/L (ref 135–145)

## 2020-05-06 LAB — TROPONIN I (HIGH SENSITIVITY)
Troponin I (High Sensitivity): <2 ng/L (ref <18)
Troponin I (High Sensitivity): <2 ng/L (ref <18)

## 2020-05-06 MED ORDER — ASPIRIN 325 MG PO TABS: 325.0000 mg | ORAL_TABLET | Freq: Once | ORAL | Status: DC

## 2020-05-06 MED ORDER — ASPIRIN 325 MG PO TABS: 325.0000 mg | ORAL_TABLET | Freq: Every day | ORAL | Status: DC

## 2020-05-06 NOTE — ED Triage Notes (Signed)
Patient here for pain in pectoral areas bilaterally close to axilla for about 12 days; did do a work out and lifting weights at that time; now improving but notices it at night when lying down; taking ibuprofen which has helped; none since last night; denies SOB, nausea, vomiting and no history of heart disease. Has not had covid vaccination.

## 2020-05-06 NOTE — ED Provider Notes (Signed)
[facility]    CSN: [id number] Arrival date & time: 05/06/20  1145      History   Chief Complaint Chief Complaint  Patient presents with  . Chest Pain    rib pain    HPI Euan Wandler is a 39 y.o. male.   HPI Seymore Brodowski is a 39 y.o. male presenting to UC with c/o 12 days of persistent but waxing and waning chest pain on both sides of his chest, worse on the Left side.  Symptoms started the day after he lifted some weights and had a redbull.  He statesthe energy drink kept him up all night.  Pain improves when walking around but worsens at night lying down.  He has taken ibuprofen, prescribed by another urgent care he went to earlier in the week but pain worsened today.  Denies SOB, diaphoresis, n/v/d. No hx of heart problems, however, he does not have a PCP and has not had a physical in years. He does report usually having normal BP.  BP is elevated in triage today.  No known family hx of CAD.  He has not received the Covid-19 vaccine. Denies cough, congestion, or fever. No leg pain or swelling. No hx of clots.    History reviewed. No pertinent past medical history.  There are no problems to display for this patient.   History reviewed. No pertinent surgical history.     Home Medications    Prior to Admission medications   Medication Sig Start Date End Date Taking? Authorizing Provider  amoxicillin (AMOXIL) 875 MG tablet Take 1 tablet (875 mg total) by mouth 2 (two) times daily. 11/08/19   Lattie Haw, MD  potassium chloride SA (K-DUR) 20 MEQ tablet Take 1 tablet (20 mEq total) by mouth daily. 01/09/19 04/02/19  Pricilla Loveless, MD    Family History Family History  Problem Relation Age of Onset  . Healthy Mother   . Healthy Father     Social History Social History   Tobacco Use  . Smoking status: Current Every Day Smoker    Packs/day: 1.00    Types: Cigarettes  . Smokeless tobacco: Never Used  Vaping Use  . Vaping Use: Never used  Substance  Use Topics  . Alcohol use: Not Currently  . Drug use: No     Allergies   Patient has no known allergies.   Review of Systems Review of Systems  Constitutional: Negative for chills and fever.  HENT: Negative for congestion, ear pain, sore throat, trouble swallowing and voice change.   Respiratory: Positive for chest tightness. Negative for cough and shortness of breath.   Cardiovascular: Positive for chest pain. Negative for palpitations.  Gastrointestinal: Negative for abdominal pain, diarrhea, nausea and vomiting.  Musculoskeletal: Negative for arthralgias, back pain and myalgias.  Skin: Negative for rash.  All other systems reviewed and are negative.    Physical Exam Triage Vital Signs ED Triage Vitals  Enc Vitals Group     BP 05/06/20 1401 (!) 176/71     Pulse Rate 05/06/20 1401 92     Resp 05/06/20 1401 16     Temp 05/06/20 1401 (!) 97.5 F (36.4 C)     Temp Source 05/06/20 1401 Oral     SpO2 05/06/20 1401 99 %     Weight 05/06/20 1404 174 lb (78.9 kg)     Height 05/06/20 1404 5' 10.5" (1.791 m)     Head Circumference --      Peak Flow --  Pain Score 05/06/20 1404 6     Pain Loc --      Pain Edu? --      Excl. in GC? --    No data found.  Updated Vital Signs BP (!) 176/71 (BP Location: Right Arm)   Pulse 92   Temp (!) 97.5 F (36.4 C) (Oral)   Resp 16   Ht 5' 10.5" (1.791 m)   Wt 174 lb (78.9 kg)   SpO2 99%   BMI 24.61 kg/m   Visual Acuity Right Eye Distance:   Left Eye Distance:   Bilateral Distance:    Right Eye Near:   Left Eye Near:    Bilateral Near:     Physical Exam Vitals and nursing note reviewed.  Constitutional:      General: He is not in acute distress.    Appearance: He is well-developed. He is not ill-appearing, toxic-appearing or diaphoretic.  HENT:     Head: Normocephalic and atraumatic.  Cardiovascular:     Rate and Rhythm: Normal rate and regular rhythm.  Pulmonary:     Effort: Pulmonary effort is normal.      Breath sounds: No decreased breath sounds, wheezing, rhonchi or rales.  Chest:     Chest wall: Tenderness (minimal) present. No mass or deformity.    Musculoskeletal:        General: Normal range of motion.     Cervical back: Normal range of motion.  Skin:    General: Skin is warm and dry.  Neurological:     Mental Status: He is alert and oriented to person, place, and time.  Psychiatric:        Behavior: Behavior normal.      UC Treatments / Results  Labs (all labs ordered are listed, but only abnormal results are displayed) Labs Reviewed - No data to display  EKG Date/Time:05/06/2020   14:15:14 Ventricular Rate: 87 PR Interval: 158 QRS Duration: 82 QT Interval: 336 QTC Calculation: 404 P-R-T axes:  67   4    59  Text Interpretation: Normal sinus rhythm, septal infarct, age undetermined. Abnormal EKG  No prior to compare.   Radiology No results found.   Procedures Procedures (including critical care time)  Medications Ordered in UC Medications  aspirin tablet 325 mg (has no administration in time range)    Initial Impression / Assessment and Plan / UC Course  I have reviewed the triage vital signs and the nursing notes.  Pertinent labs & imaging results that were available during my care of the patient were reviewed by me and considered in my medical decision making (see chart for details).     Discussed EKG with pt Encouraged further evaluation in emergency department Pt understanding and agreeable to be taken via EMS to the hospital Pt discharged in stable condition  Final Clinical Impressions(s) / UC Diagnoses   Final diagnoses:  Chest pain, unspecified type  Abnormal EKG   Discharge Instructions   None    ED Prescriptions    None     PDMP not reviewed this encounter.   Lurene Shadow, PA-C 05/06/20 1440

## 2020-05-06 NOTE — ED Triage Notes (Signed)
Pt to triage via [facility] from UC in [place] for bilateral pectoral chest pain (burning) x 2 weeks.  Pain initially started after working out but is worse when lying down at night.  Denies SOB, nausea, and vomiting.  Given 324mg  ASA at UC.  18g IV LAC.  No pain at this time.

## 2020-05-07 MED ORDER — KETOROLAC TROMETHAMINE 15 MG/ML IJ SOLN
15.0000 mg | Freq: Once | INTRAMUSCULAR | Status: AC
  Administered 2020-05-07: 15 mg via INTRAVENOUS
  Filled 2020-05-07: qty 1

## 2020-05-07 NOTE — ED Provider Notes (Signed)
[facility] EMERGENCY DEPARTMENT Provider Note   CSN: [id number] Arrival date & time: 05/06/20  1520     History Chief Complaint  Patient presents with  . Chest Pain    Johnathan Good is a 39 y.o. male.  HPI   Patient presents to the ED via EMS from urgent care for chest pain.  Patient reports he has had bilateral chest pain for several weeks.  Intermittent in nature.  The pain is mostly associated in the axilla.  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  Patient states most of his discomfort began after lifting weights several weeks ago and since that time he has had difficulty obtaining symptom relief.  Ibuprofen has helped some.  Patient states approximately 2 days ago the pain worsened which prompted him to seek evaluation at the urgent care.  Patient was subsequent transferred from urgent care to the emergency department for evaluation and cardiac rule out.  Patient does smoke daily.  No drug use.  No alcohol use.  Patient denies any history of blood clots.  No previous history of cardiac disease.  No major medical conditions.  Chest pain is throbbing in nature.  Moderate.  Worse with movement.  No pain with deep breathing.  By the time of my evaluation encounter with the patient, he had been in the emergency department for approximately 19 hours.       History reviewed. No pertinent past medical history.  There are no problems to display for this patient.   History reviewed. No pertinent surgical history.     Family History  Problem Relation Age of Onset  . Healthy Mother   . Healthy Father     Social History   Tobacco Use  . Smoking status: Current Every Day Smoker    Packs/day: 1.00    Types: Cigarettes  . Smokeless tobacco: Never Used  Vaping Use  . Vaping Use: Never used  Substance Use Topics  . Alcohol use: Not Currently  . Drug use: No    Home Medications Prior to Admission medications   Medication Sig Start  Date End Date Taking? Authorizing Provider  amoxicillin (AMOXIL) 875 MG tablet Take 1 tablet (875 mg total) by mouth 2 (two) times daily. 11/08/19   Lattie Haw, MD  potassium chloride SA (K-DUR) 20 MEQ tablet Take 1 tablet (20 mEq total) by mouth daily. 01/09/19 04/02/19  Pricilla Loveless, MD    Allergies    Patient has no known allergies.  Review of Systems   Review of Systems  Constitutional: Negative for chills and fever.  HENT: Negative for ear pain and sore throat.   Eyes: Negative for pain and visual disturbance.  Respiratory: Negative for cough and shortness of breath.   Cardiovascular: Positive for chest pain. Negative for palpitations.  Gastrointestinal: Negative for abdominal pain, diarrhea, nausea and vomiting.  Genitourinary: Negative for dysuria and hematuria.  Musculoskeletal: Negative for arthralgias and back pain.  Skin: Negative for color change and rash.  Neurological: Negative for seizures and syncope.  All other systems reviewed and are negative.   Physical Exam Updated Vital Signs BP 113/70 (BP Location: Left Arm)   Pulse 95   Temp 98.4 F (36.9 C) (Oral)   Resp 12   SpO2 100%   Physical Exam Vitals and nursing note reviewed.  Constitutional:      General: He is not in acute distress.    Appearance: He is well-developed and normal weight. He is not  ill-appearing or toxic-appearing.  HENT:     Head: Normocephalic and atraumatic.  Eyes:     Extraocular Movements: Extraocular movements intact.     Conjunctiva/sclera: Conjunctivae normal.     Pupils: Pupils are equal, round, and reactive to light.  Cardiovascular:     Rate and Rhythm: Normal rate and regular rhythm.     Pulses: Normal pulses.     Heart sounds: No murmur heard.   Pulmonary:     Effort: Pulmonary effort is normal. No respiratory distress.     Breath sounds: Normal breath sounds.  Chest:    Abdominal:     General: There is no distension.     Palpations: Abdomen is soft.      Tenderness: There is no abdominal tenderness.  Musculoskeletal:     Cervical back: Neck supple.     Right lower leg: No edema.     Left lower leg: No edema.  Skin:    General: Skin is warm and dry.     Capillary Refill: Capillary refill takes less than 2 seconds.  Neurological:     General: No focal deficit present.     Mental Status: He is alert and oriented to person, place, and time. Mental status is at baseline.  Psychiatric:        Mood and Affect: Mood normal.        Behavior: Behavior normal.     ED Results / Procedures / Treatments   Labs (all labs ordered are listed, but only abnormal results are displayed) Labs Reviewed  BASIC METABOLIC PANEL - Abnormal; Notable for the following components:      Result Value   Glucose, Bld 107 (*)    Creatinine, Ser 1.25 (*)    All other components within normal limits  CBC  TROPONIN I (HIGH SENSITIVITY)  TROPONIN I (HIGH SENSITIVITY)    EKG None  Radiology DG Chest 2 View  Result Date: 05/06/2020 CLINICAL DATA:  Acute chest pain EXAM: CHEST - 2 VIEW COMPARISON:  08/06/2019 chest radiograph FINDINGS: The cardiomediastinal silhouette is unremarkable. There is no evidence of focal airspace disease, pulmonary edema, suspicious pulmonary nodule/mass, pleural effusion, or pneumothorax. No acute bony abnormalities are identified. IMPRESSION: No active cardiopulmonary disease. Electronically Signed   By: Harmon Pier M.D.   On: 05/06/2020 16:11    Procedures Procedures (including critical care time)  Medications Ordered in ED Medications  ketorolac (TORADOL) 15 MG/ML injection 15 mg (has no administration in time range)    ED Course   Johnathan Good is a 39 y.o. male with PMHx listed that presents to the Emergency Department complaint of Chest Pain   ED Course: Initial exam completed.   Well-appearing and hemodynamically stable.  Nontoxic and afebrile.  Physical exam significant for 39 year old male with clear breath sounds  bilaterally, extremities well-perfused, 2+ equal radial pulses, no lower extremity edema, and abdomen that is soft, nondistended, nontender with reproducible chest wall pain at the left axilla.  Initial differential includes acute coronary syndrome, pulmonary embolism, pneumonia, GERD, and musculoskeletal pain.   Triage work-up reviewed.  Initial troponin undetectable.  Delta troponin undetectable.  BMP with no acute electrolyte requiring urgent intervention.  CBC without evidence of leukocytosis or leukopenia and stable hemoglobin.  CXR with no active cardiopulmonary disease.  EKG sinus rhythm at rate 91 with normal intervals and no acute ST changes.  Will provide 1 dose IV Toradol for anti-inflammatory effect.  Overall, the patient's presentation is consistent with musculoskeletal/reproducible chest wall pain.  Doubt acute coronary syndrome given the unremarkable EKG and delta troponin.  Doubt pulmonary embolism as patient has no evidence of tachycardia, hypoxia, unilateral leg swelling, hemoptysis, or recent immobilization or previous history of DVT/PE.   Diagnostics Vital Signs: reviewed Labs: reviewed and significant findings discussed above Imaging: personally reviewed images interpreted by radiology EKG: reviewed Records: nursing notes along with previous records reviewed and pertinent data discussed   Consults:  None   Reevaluation/Disposition:  Upon reevaluation, patients symptoms stable/improved. No active nausea/vomiting and ambulatory without assistance prior to discharge from the emergency department.    All questions answered.  Strict return precautions were discussed. Additionally we discussed establishing and/or following-up with primary care physician.  Patient and/or family was understanding and in agreement with today's assessment and plan.   Campbell Riches, MD Emergency Medicine, PGY-3   Note: Dragon medical dictation software was used in the creation of this note.   Final  Clinical Impression(s) / ED Diagnoses Final diagnoses:  None    Rx / DC Orders ED Discharge Orders    None       Nino Parsley, MD 05/07/20 1507    Shon Baton, MD 05/07/20 [phone]

## 2020-05-07 NOTE — ED Notes (Signed)
Patient verbalizes understanding of discharge instructions. Opportunity for questioning and answers were provided. Armband removed by staff, pt discharged from ED ambulatory.   

## 2020-05-07 NOTE — Discharge Instructions (Signed)
Please refrain from smoking. Follow-up with your primary care physician. Return to ED for worsening or concerning symptoms. Please take Tylenol and/or ibuprofen as needed for pain control.  You may alternate every 6 hours.

## 2020-05-07 NOTE — ED Notes (Signed)
Pt is waiting outside

## 2021-11-27 IMAGING — DX DG CHEST 2V
2 series · 2 of 2 positions shown · non-contrast
Comparison: 08/06/2019 chest radiograph

CLINICAL DATA: Acute chest pain

EXAM:
CHEST - 2 VIEW

[chest pa]
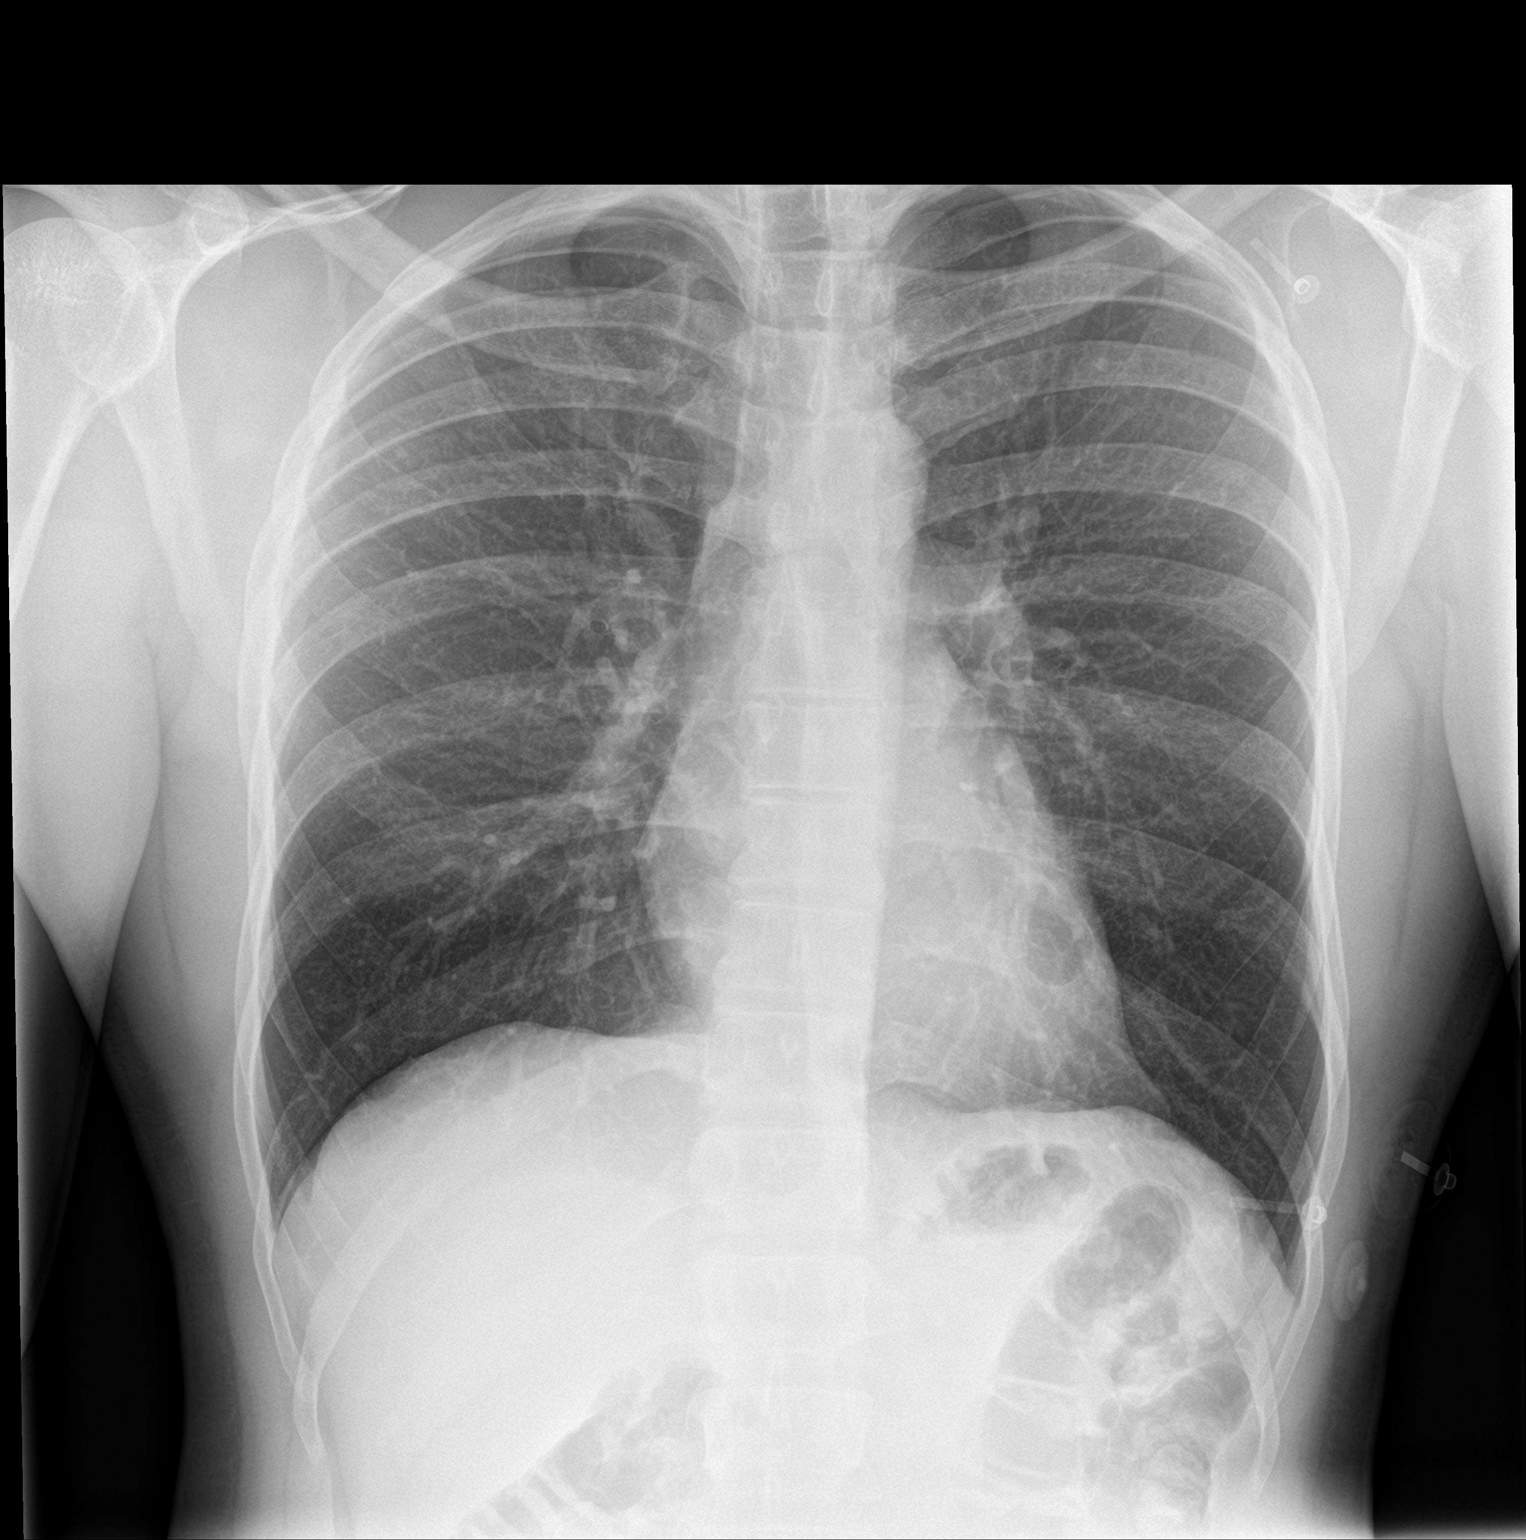

[chest lat]
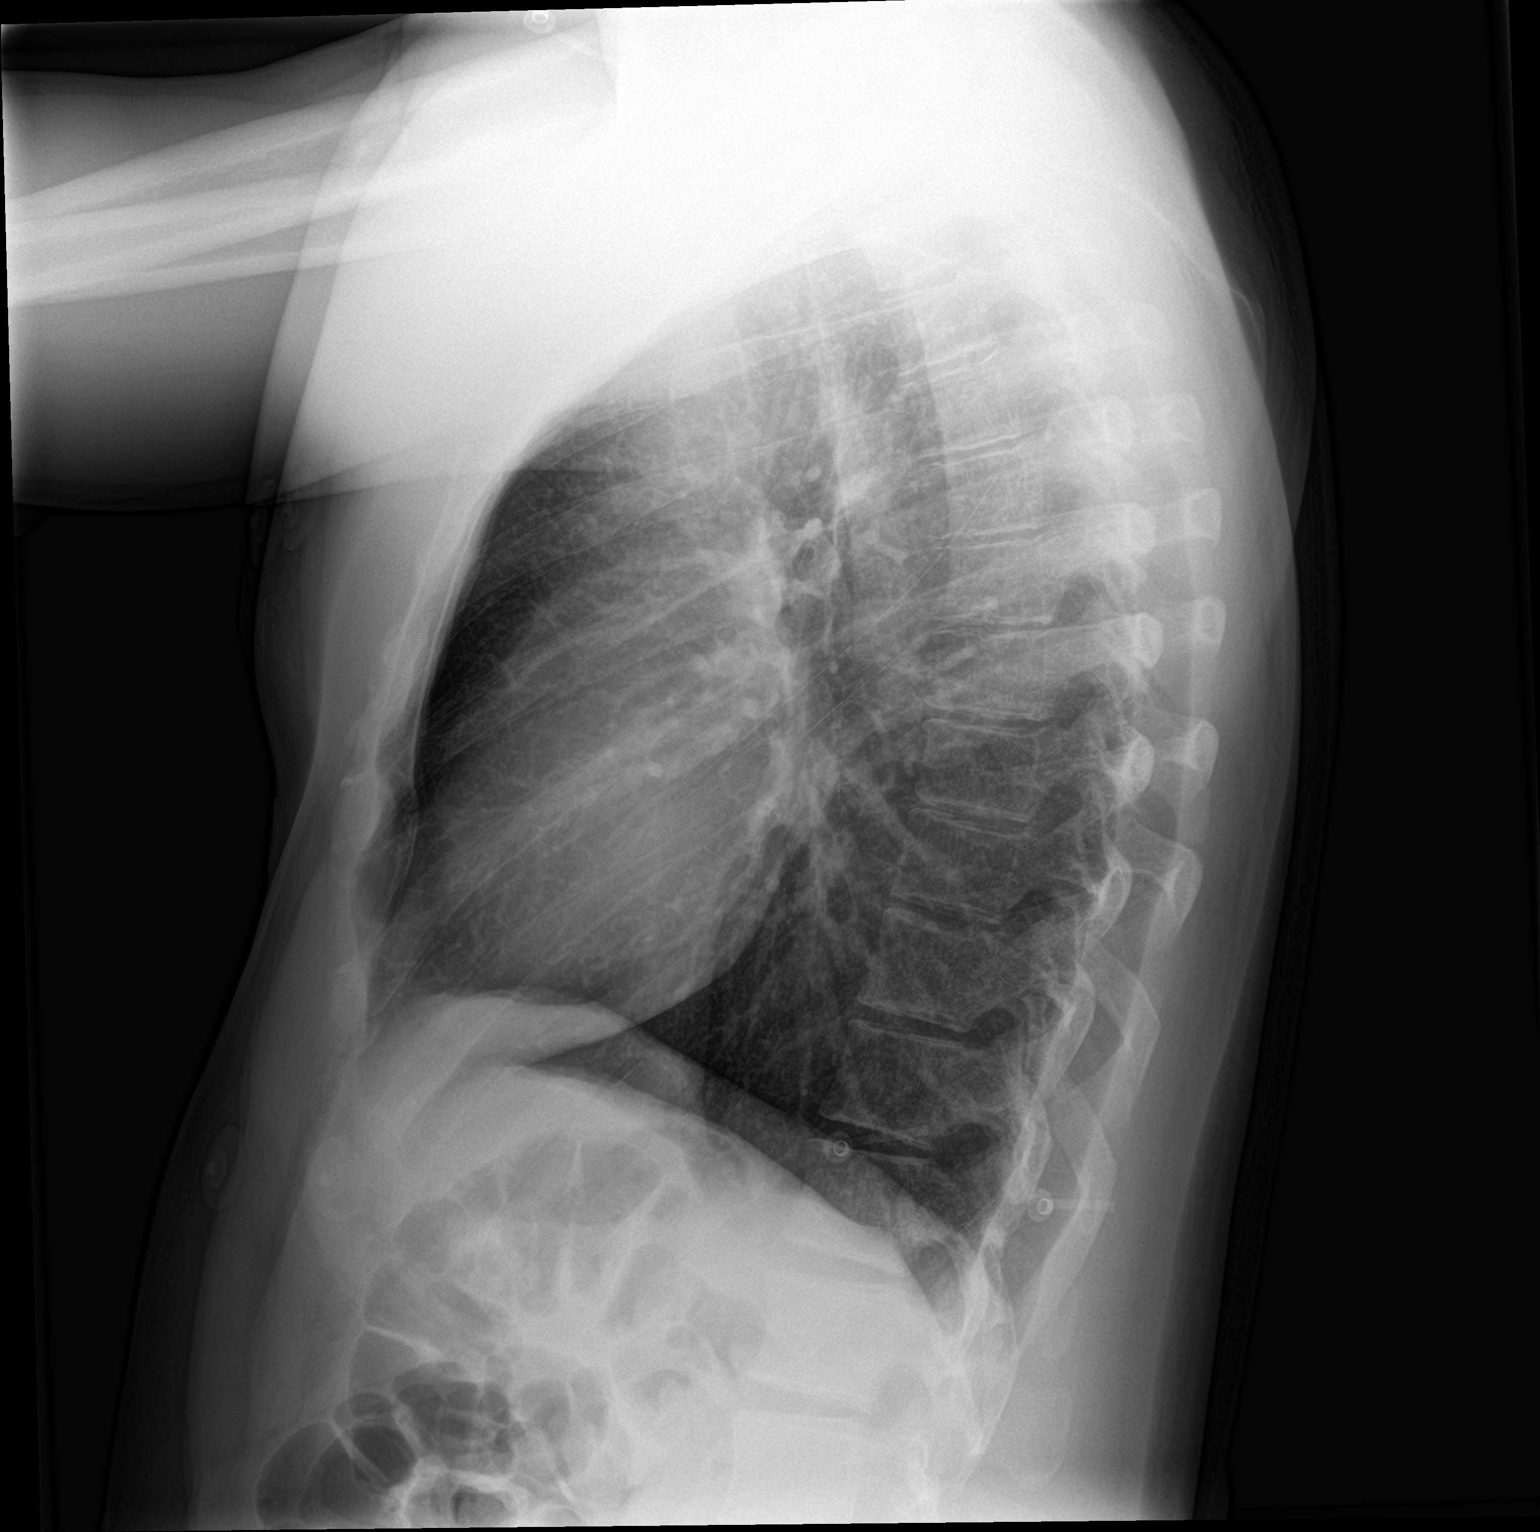

[2 of 2 positions shown; findings below may reference images not displayed]

FINDINGS: The cardiomediastinal silhouette is unremarkable.

There is no evidence of focal airspace disease, pulmonary edema,
suspicious pulmonary nodule/mass, pleural effusion, or pneumothorax.

No acute bony abnormalities are identified.
IMPRESSION: No active cardiopulmonary disease.
